# Patient Record
Sex: Female | Born: 1950 | Race: White | Hispanic: No | Smoking: Never smoker
Health system: Southern US, Community
[De-identification: ages and names within clinical notes are randomized; demographics above are authoritative.]

## PROBLEM LIST (undated history)

## (undated) DIAGNOSIS — Z950 Presence of cardiac pacemaker: Secondary | ICD-10-CM

## (undated) DIAGNOSIS — I509 Heart failure, unspecified: Secondary | ICD-10-CM

## (undated) DIAGNOSIS — I2510 Atherosclerotic heart disease of native coronary artery without angina pectoris: Secondary | ICD-10-CM

## (undated) DIAGNOSIS — F319 Bipolar disorder, unspecified: Secondary | ICD-10-CM

## (undated) DIAGNOSIS — I10 Essential (primary) hypertension: Secondary | ICD-10-CM

## (undated) HISTORY — PX: PACEMAKER INSERTION: SHX728

## (undated) HISTORY — PX: OTHER SURGICAL HISTORY: SHX169

## (undated) HISTORY — PX: ABDOMINAL HYSTERECTOMY: SHX81

---

## 2010-11-28 ENCOUNTER — Emergency Department (INDEPENDENT_AMBULATORY_CARE_PROVIDER_SITE_OTHER): Payer: Managed Care, Other (non HMO)

## 2010-11-28 ENCOUNTER — Emergency Department (HOSPITAL_BASED_OUTPATIENT_CLINIC_OR_DEPARTMENT_OTHER)
Admission: EM | Admit: 2010-11-28 | Discharge: 2010-11-28 | Disposition: A | Discharge status: Home / Self Care | Payer: Managed Care, Other (non HMO) | Attending: Emergency Medicine | Admitting: Emergency Medicine

## 2010-11-28 DIAGNOSIS — I509 Heart failure, unspecified: Secondary | ICD-10-CM | POA: Insufficient documentation

## 2010-11-28 DIAGNOSIS — G43909 Migraine, unspecified, not intractable, without status migrainosus: Secondary | ICD-10-CM | POA: Insufficient documentation

## 2010-11-28 DIAGNOSIS — R51 Headache: Secondary | ICD-10-CM

## 2010-11-28 DIAGNOSIS — I10 Essential (primary) hypertension: Secondary | ICD-10-CM | POA: Insufficient documentation

## 2010-11-28 DIAGNOSIS — G319 Degenerative disease of nervous system, unspecified: Secondary | ICD-10-CM | POA: Insufficient documentation

## 2010-11-28 DIAGNOSIS — Z950 Presence of cardiac pacemaker: Secondary | ICD-10-CM

## 2010-11-28 LAB — CBC
HCT: 39.6 % (ref 36.0–46.0)
Hemoglobin: 13.4 g/dL (ref 12.0–15.0)
MCHC: 33.8 g/dL (ref 30.0–36.0)
MCV: 93 fL (ref 78.0–100.0)
WBC: 6.4 10*3/uL (ref 4.0–10.5)

## 2010-11-28 LAB — DIFFERENTIAL
Basophils Absolute: 0.1 10*3/uL (ref 0.0–0.1)
Eosinophils Relative: 3 % (ref 0–5)
Lymphocytes Relative: 40 % (ref 12–46)
Lymphs Abs: 2.6 10*3/uL (ref 0.7–4.0)
Monocytes Absolute: 0.6 10*3/uL (ref 0.1–1.0)
Neutro Abs: 3 10*3/uL (ref 1.7–7.7)

## 2010-11-28 LAB — COMPREHENSIVE METABOLIC PANEL
ALT: 31 U/L (ref 0–35)
AST: 33 U/L (ref 0–37)
Alkaline Phosphatase: 42 U/L (ref 39–117)
CO2: 26 mEq/L (ref 19–32)
Chloride: 105 mEq/L (ref 96–112)
GFR calc Af Amer: >60 mL/min (ref >60)
GFR calc non Af Amer: >60 mL/min (ref >60)
Glucose, Bld: 109 mg/dL — ABNORMAL HIGH (ref 70–99)
Sodium: 141 mEq/L (ref 135–145)
Total Bilirubin: 1.1 mg/dL (ref 0.3–1.2)

## 2015-11-13 ENCOUNTER — Emergency Department (HOSPITAL_BASED_OUTPATIENT_CLINIC_OR_DEPARTMENT_OTHER): Payer: Medicare HMO

## 2015-11-13 ENCOUNTER — Emergency Department (HOSPITAL_BASED_OUTPATIENT_CLINIC_OR_DEPARTMENT_OTHER)
Admission: EM | Admit: 2015-11-13 | Discharge: 2015-11-13 | Disposition: A | Discharge status: Home / Self Care | Payer: Medicare HMO | Attending: Emergency Medicine | Admitting: Emergency Medicine

## 2015-11-13 ENCOUNTER — Encounter (HOSPITAL_BASED_OUTPATIENT_CLINIC_OR_DEPARTMENT_OTHER): Payer: Self-pay | Admitting: Emergency Medicine

## 2015-11-13 DIAGNOSIS — I10 Essential (primary) hypertension: Secondary | ICD-10-CM | POA: Diagnosis not present

## 2015-11-13 DIAGNOSIS — I509 Heart failure, unspecified: Secondary | ICD-10-CM | POA: Diagnosis not present

## 2015-11-13 DIAGNOSIS — Z792 Long term (current) use of antibiotics: Secondary | ICD-10-CM | POA: Diagnosis not present

## 2015-11-13 DIAGNOSIS — J9801 Acute bronchospasm: Secondary | ICD-10-CM | POA: Diagnosis not present

## 2015-11-13 DIAGNOSIS — Z950 Presence of cardiac pacemaker: Secondary | ICD-10-CM | POA: Insufficient documentation

## 2015-11-13 DIAGNOSIS — I2510 Atherosclerotic heart disease of native coronary artery without angina pectoris: Secondary | ICD-10-CM | POA: Insufficient documentation

## 2015-11-13 DIAGNOSIS — R0602 Shortness of breath: Secondary | ICD-10-CM | POA: Diagnosis present

## 2015-11-13 HISTORY — DX: Presence of cardiac pacemaker: Z95.0

## 2015-11-13 HISTORY — DX: Heart failure, unspecified: I50.9

## 2015-11-13 HISTORY — DX: Essential (primary) hypertension: I10

## 2015-11-13 HISTORY — DX: Atherosclerotic heart disease of native coronary artery without angina pectoris: I25.10

## 2015-11-13 MED ORDER — IPRATROPIUM-ALBUTEROL 0.5-2.5 (3) MG/3ML IN SOLN
3.0000 mL | RESPIRATORY_TRACT | Status: DC
  Administered 2015-11-13: 3 mL via RESPIRATORY_TRACT
  Filled 2015-11-13: qty 3

## 2015-11-13 MED ORDER — ALBUTEROL SULFATE HFA 108 (90 BASE) MCG/ACT IN AERS
2.0000 | INHALATION_SPRAY | RESPIRATORY_TRACT | Status: DC | PRN
  Administered 2015-11-13: 2 via RESPIRATORY_TRACT
  Filled 2015-11-13: qty 6.7

## 2015-11-13 NOTE — Discharge Instructions (Signed)
Bronchospasm, Adult  A bronchospasm is a spasm or tightening of the airways going into the lungs. During a bronchospasm breathing becomes more difficult because the airways get smaller. When this happens there can be coughing, a whistling sound when breathing (wheezing), and difficulty breathing. Bronchospasm is often associated with asthma, but not all patients who experience a bronchospasm have asthma.  CAUSES   A bronchospasm is caused by inflammation or irritation of the airways. The inflammation or irritation may be triggered by:   · Allergies (such as to animals, pollen, food, or mold). Allergens that cause bronchospasm may cause wheezing immediately after exposure or many hours later.    · Infection. Viral infections are believed to be the most common cause of bronchospasm.    · Exercise.    · Irritants (such as pollution, cigarette smoke, strong odors, aerosol sprays, and paint fumes).    · Weather changes. Winds increase molds and pollens in the air. Rain refreshes the air by washing irritants out. Cold air may cause inflammation.    · Stress and emotional upset.    SIGNS AND SYMPTOMS   · Wheezing.    · Excessive nighttime coughing.    · Frequent or severe coughing with a simple cold.    · Chest tightness.    · Shortness of breath.    DIAGNOSIS   Bronchospasm is usually diagnosed through a history and physical exam. Tests, such as chest X-rays, are sometimes done to look for other conditions.  TREATMENT   · Inhaled medicines can be given to open up your airways and help you breathe. The medicines can be given using either an inhaler or a nebulizer machine.  · Corticosteroid medicines may be given for severe bronchospasm, usually when it is associated with asthma.  HOME CARE INSTRUCTIONS   · Always have a plan prepared for seeking medical care. Know when to call your health care provider and local emergency services (911 in the [place] Know where you can access local emergency care.  · Only take medicines as  directed by your health care provider.  · If you were prescribed an inhaler or nebulizer machine, ask your health care provider to explain how to use it correctly. Always use a spacer with your inhaler if you were given one.  · It is necessary to remain calm during an attack. Try to relax and breathe more slowly.   · Control your home environment in the following ways:      Change your heating and air conditioning filter at least once a month.      Limit your use of fireplaces and wood stoves.    Do not smoke and do not allow smoking in your home.      Avoid exposure to perfumes and fragrances.      Get rid of pests (such as roaches and mice) and their droppings.      Throw away plants if you see mold on them.      Keep your house clean and dust free.      Replace carpet with wood, tile, or vinyl flooring. Carpet can trap dander and dust.      Use allergy-proof pillows, mattress covers, and box spring covers.      Wash bed sheets and blankets every week in hot water and dry them in a dryer.      Use blankets that are made of polyester or cotton.      Wash hands frequently.  SEEK MEDICAL CARE IF:   · You have muscle aches.    · You have chest pain.    · The sputum changes from clear or   white to yellow, green, gray, or bloody.    · The sputum you cough up gets thicker.    · There are problems that may be related to the medicine you are given, such as a rash, itching, swelling, or trouble breathing.    SEEK IMMEDIATE MEDICAL CARE IF:   · You have worsening wheezing and coughing even after taking your prescribed medicines.    · You have increased difficulty breathing.    · You develop severe chest pain.  MAKE SURE YOU:   · Understand these instructions.  · Will watch your condition.  · Will get help right away if you are not doing well or get worse.     This information is not intended to replace advice given to you by your health care provider. Make sure you discuss any questions you have with your health care  provider.     Document Released: 09/04/2003 Document Revised: 09/22/2014 Document Reviewed: 02/21/2013  Elsevier Interactive Patient Education ©2016 [employer]

## 2015-11-13 NOTE — ED Notes (Signed)
Sob, productive cough

## 2015-11-13 NOTE — ED Notes (Signed)
Pt was seen at [facility] twice withhin the last 1week for same. Pt has had two xrays.

## 2015-11-13 NOTE — ED Notes (Signed)
C/o cough and sob over a week,  Has been seen at [facility] without any relief

## 2015-11-13 NOTE — ED Provider Notes (Signed)
CSN: [id number]     Arrival date & time 11/13/15  0002 History  By signing my name below, I, Lauren Skinner, attest that this documentation has been prepared under the direction and in the presence of Paula Libra, MD. Electronically Signed: Bethel Skinner, ED Scribe. 11/13/2015. 12:36 AM   Chief Complaint  Patient presents with  . Shortness of Breath   The history is provided by the patient. No language interpreter was used.   Lauren Skinner is a 65 y.o. female with history of CHF with a pacemaker, CAD, and HTN who presents to the Emergency Department complaining of SOB with onset more than 1 week ago. Pt states that she has been seen in the ED at [facility] 3 times in the last week for the same and was prescribed Zithromax that caused her to have a rash. Associated symptoms include cough productive of frothy light green sputum, hoarse voice and wheezing. Symptoms are moderate and worse with exertion. She does not have an inhaler or a nebulizer machine at home. Pt denies fever.    Past Medical History  Diagnosis Date  . Pacemaker   . CHF (congestive heart failure) (HCC)   . Hypertension   . Coronary artery disease    Past Surgical History  Procedure Laterality Date  . Abdominal hysterectomy    . C section    . Pacemaker insertion     No family history on file. Social History  Substance Use Topics  . Smoking status: Never Smoker   . Smokeless tobacco: None  . Alcohol Use: No   OB History    No data available     Review of Systems  10 Systems reviewed and all are negative for acute change except as noted in the HPI.  Allergies  Review of patient's allergies indicates no known allergies.  Home Medications   Prior to Admission medications   Medication Sig Start Date End Date Taking? Authorizing Provider  azithromycin (ZITHROMAX) 250 MG tablet Take by mouth daily.   Yes Historical Provider, MD   BP 135/90 mmHg  Pulse 88  Temp(Src) 99.1 F (37.3 C) (Oral)   Resp 18  Ht  (1.626 m)  Wt 240 lb (108.863 kg)  BMI 41.18 kg/m2  SpO2 99% Physical Exam  Nursing note and vitals reviewed. General: Well-developed, well-nourished female in no acute distress; appearance consistent with age of record HENT: normocephalic; atraumatic Eyes: pupils equal, round and reactive to light; extraocular muscles intact Neck: supple Heart: regular rate and rhythm Lungs: Decreased air movement bilaterally; coughing on deep breathing  Abdomen: soft; nondistended; nontender; bowel sounds present Extremities: No deformity; full range of motion; pulses normal Neurologic: Awake, alert and oriented; motor function intact in all extremities and symmetric; no facial droop Skin: Warm and dry; multiple seborrhoic keratoses   Psychiatric: Normal mood and affect  ED Course  Procedures (including critical care time) DIAGNOSTIC STUDIES: Oxygen Saturation is 99% on RA,  normal by my interpretation.     MDM  1:50 AM Air movement improved after DuoNeb treatment. Will provide patient with an inhaler.    Paula Libra, MD 11/13/15 [phone]

## 2015-11-15 ENCOUNTER — Encounter (HOSPITAL_BASED_OUTPATIENT_CLINIC_OR_DEPARTMENT_OTHER): Payer: Self-pay

## 2015-11-15 ENCOUNTER — Emergency Department (HOSPITAL_BASED_OUTPATIENT_CLINIC_OR_DEPARTMENT_OTHER)
Admission: EM | Admit: 2015-11-15 | Discharge: 2015-11-15 | Disposition: A | Discharge status: Home / Self Care | Payer: Medicare HMO | Attending: Emergency Medicine | Admitting: Emergency Medicine

## 2015-11-15 ENCOUNTER — Emergency Department (HOSPITAL_BASED_OUTPATIENT_CLINIC_OR_DEPARTMENT_OTHER): Payer: Medicare HMO

## 2015-11-15 DIAGNOSIS — Z880 Allergy status to penicillin: Secondary | ICD-10-CM | POA: Diagnosis not present

## 2015-11-15 DIAGNOSIS — J45909 Unspecified asthma, uncomplicated: Secondary | ICD-10-CM

## 2015-11-15 DIAGNOSIS — I509 Heart failure, unspecified: Secondary | ICD-10-CM | POA: Diagnosis not present

## 2015-11-15 DIAGNOSIS — Z792 Long term (current) use of antibiotics: Secondary | ICD-10-CM | POA: Diagnosis not present

## 2015-11-15 DIAGNOSIS — I10 Essential (primary) hypertension: Secondary | ICD-10-CM | POA: Diagnosis not present

## 2015-11-15 DIAGNOSIS — Z79899 Other long term (current) drug therapy: Secondary | ICD-10-CM | POA: Insufficient documentation

## 2015-11-15 DIAGNOSIS — R0989 Other specified symptoms and signs involving the circulatory and respiratory systems: Secondary | ICD-10-CM

## 2015-11-15 DIAGNOSIS — I2510 Atherosclerotic heart disease of native coronary artery without angina pectoris: Secondary | ICD-10-CM | POA: Insufficient documentation

## 2015-11-15 DIAGNOSIS — F329 Major depressive disorder, single episode, unspecified: Secondary | ICD-10-CM | POA: Insufficient documentation

## 2015-11-15 DIAGNOSIS — R0602 Shortness of breath: Secondary | ICD-10-CM | POA: Diagnosis present

## 2015-11-15 DIAGNOSIS — J45901 Unspecified asthma with (acute) exacerbation: Secondary | ICD-10-CM | POA: Insufficient documentation

## 2015-11-15 HISTORY — DX: Bipolar disorder, unspecified: F31.9

## 2015-11-15 LAB — CBC WITH DIFFERENTIAL/PLATELET
BASOS ABS: 0 10*3/uL (ref 0.0–0.1)
BASOS PCT: 1 %
EOS ABS: 0.2 10*3/uL (ref 0.0–0.7)
EOS PCT: 3 %
HCT: 40.5 % (ref 36.0–46.0)
Hemoglobin: 13.1 g/dL (ref 12.0–15.0)
LYMPHS PCT: 26 %
Lymphs Abs: 1.8 10*3/uL (ref 0.7–4.0)
MCH: 32.1 pg (ref 26.0–34.0)
MCHC: 32.3 g/dL (ref 30.0–36.0)
MCV: 99.3 fL (ref 78.0–100.0)
MONO ABS: 0.8 10*3/uL (ref 0.1–1.0)
Monocytes Relative: 12 %
Neutro Abs: 3.9 10*3/uL (ref 1.7–7.7)
Neutrophils Relative %: 58 %
PLATELETS: 199 10*3/uL (ref 150–400)
RBC: 4.08 MIL/uL (ref 3.87–5.11)
RDW: 13.9 % (ref 11.5–15.5)
WBC: 6.7 10*3/uL (ref 4.0–10.5)

## 2015-11-15 LAB — BASIC METABOLIC PANEL
ANION GAP: 10 (ref 5–15)
BUN: 12 mg/dL (ref 6–20)
CALCIUM: 9 mg/dL (ref 8.9–10.3)
CO2: 27 mmol/L (ref 22–32)
Chloride: 100 mmol/L — ABNORMAL LOW (ref 101–111)
Creatinine, Ser: 0.78 mg/dL (ref 0.44–1.00)
GFR calc Af Amer: >60 mL/min (ref >60)
GLUCOSE: 102 mg/dL — AB (ref 65–99)
Potassium: 4.2 mmol/L (ref 3.5–5.1)
SODIUM: 137 mmol/L (ref 135–145)

## 2015-11-15 LAB — TROPONIN I

## 2015-11-15 LAB — BRAIN NATRIURETIC PEPTIDE: B Natriuretic Peptide: 373 pg/mL — ABNORMAL HIGH (ref 0.0–100.0)

## 2015-11-15 MED ORDER — PREDNISONE 20 MG PO TABS: 60.0000 mg | ORAL_TABLET | Freq: Every day | ORAL | Status: AC

## 2015-11-15 MED ORDER — FUROSEMIDE 10 MG/ML IJ SOLN
40.0000 mg | Freq: Once | INTRAMUSCULAR | Status: AC
  Administered 2015-11-15: 40 mg via INTRAVENOUS
  Filled 2015-11-15: qty 4

## 2015-11-15 MED ORDER — METHYLPREDNISOLONE SODIUM SUCC 125 MG IJ SOLR
125.0000 mg | Freq: Once | INTRAMUSCULAR | Status: AC
  Administered 2015-11-15: 125 mg via INTRAVENOUS
  Filled 2015-11-15: qty 2

## 2015-11-15 NOTE — Discharge Instructions (Signed)
Your labs today were reassuring. Her chest x-ray showed mild vascular congestion and we have given you a dose of Lasix in your IV. I recommend for the next 3 days you increase her Lasix from 20 mg once a day to 40 mg once a day. After these 3 days go back to your regular dose of Lasix 20 mg once a day.   I suspect that some of your symptoms are also secondary to asthma. We are spinning you on a short course of steroids that may help with her symptoms. You may start these steroids the morning of March 3 as we have given you a dose of IV steroids in the emergency department.   Your chest x-ray also showed a possible left-sided opacity that could be pneumonia versus a normal finding called atelectasis. Given your cough is improving and is no longer productive, you don't have fever and don't have an elevated white blood cell count think pneumonia is less likely. Given your multiple allergies to multiple antibiotics I feel that putting you on antibiotics when I think pneumonia is unlikely could be dangerous to do and I recommend that if your symptoms aren't improving despite increased dose of your Lasix as well as steroids and albuterol that you follow-up with your primary care physician to discuss further treatment including the possibility of being placed on antibiotics.    Asthma, Acute Bronchospasm Acute bronchospasm caused by asthma is also referred to as an asthma attack. Bronchospasm means your air passages become narrowed. The narrowing is caused by inflammation and tightening of the muscles in the air tubes (bronchi) in your lungs. This can make it hard to breathe or cause you to wheeze and cough. CAUSES Possible triggers are: 1. Animal dander from the skin, hair, or feathers of animals. 2. Dust mites contained in house dust. 3. Cockroaches. 4. Pollen from trees or grass. 5. Mold. 6. Cigarette or tobacco smoke. 7. Air pollutants such as dust, household cleaners, hair sprays, aerosol sprays,  paint fumes, strong chemicals, or strong odors. 8. Cold air or weather changes. Cold air may trigger inflammation. Winds increase molds and pollens in the air. 9. Strong emotions such as crying or laughing hard. 10. Stress. 11. Certain medicines such as aspirin or beta-blockers. 12. Sulfites in foods and drinks, such as dried fruits and wine. 13. Infections or inflammatory conditions, such as a flu, cold, or inflammation of the nasal membranes (rhinitis). 14. Gastroesophageal reflux disease (GERD). GERD is a condition where stomach acid backs up into your esophagus. 15. Exercise or strenuous activity. SIGNS AND SYMPTOMS  1. Wheezing. 2. Excessive coughing, particularly at night. 3. Chest tightness. 4. Shortness of breath. DIAGNOSIS  Your health care provider will ask you about your medical history and perform a physical exam. A chest X-ray or blood testing may be performed to look for other causes of your symptoms or other conditions that may have triggered your asthma attack. TREATMENT  Treatment is aimed at reducing inflammation and opening up the airways in your lungs. Most asthma attacks are treated with inhaled medicines. These include quick relief or rescue medicines (such as bronchodilators) and controller medicines (such as inhaled corticosteroids). These medicines are sometimes given through an inhaler or a nebulizer. Systemic steroid medicine taken by mouth or given through an IV tube also can be used to reduce the inflammation when an attack is moderate or severe. Antibiotic medicines are only used if a bacterial infection is present.  HOME CARE INSTRUCTIONS   Rest.  Drink plenty of liquids. This helps the mucus to remain thin and be easily coughed up. Only use caffeine in moderation and do not use alcohol until you have recovered from your illness.  Do not smoke. Avoid being exposed to secondhand smoke.  You play a critical role in keeping yourself in good health. Avoid  exposure to things that cause you to wheeze or to have breathing problems.  Keep your medicines up-to-date and available. Carefully follow your health care provider's treatment plan.  Take your medicine exactly as prescribed.  When pollen or pollution is bad, keep windows closed and use an air conditioner or go to places with air conditioning.  Asthma requires careful medical care. See your health care provider for a follow-up as advised. If you are more than [redacted] weeks pregnant and you were prescribed any new medicines, let your obstetrician know about the visit and how you are doing. Follow up with your health care provider as directed.  After you have recovered from your asthma attack, make an appointment with your outpatient doctor to talk about ways to reduce the likelihood of future attacks. If you do not have a doctor who manages your asthma, make an appointment with a primary care doctor to discuss your asthma. SEEK IMMEDIATE MEDICAL CARE IF:   You are getting worse.  You have trouble breathing. If severe, call your local emergency services (911 in the [place]  You develop chest pain or discomfort.  You are vomiting.  You are not able to keep fluids down.  You are coughing up yellow, green, brown, or bloody sputum.  You have a fever and your symptoms suddenly get worse.  You have trouble swallowing. MAKE SURE YOU:   Understand these instructions.  Will watch your condition.  Will get help right away if you are not doing well or get worse.   This information is not intended to replace advice given to you by your health care provider. Make sure you discuss any questions you have with your health care provider.   Document Released: 12/17/2006 Document Revised: 09/06/2013 Document Reviewed: 03/09/2013 Elsevier Interactive Patient Education 2016 [employer].  How to Use an Inhaler Proper inhaler technique is very important. Good technique ensures that the medicine reaches  the lungs. Poor technique results in depositing the medicine on the tongue and back of the throat rather than in the airways. If you do not use the inhaler with good technique, the medicine will not help you. STEPS TO FOLLOW IF USING AN INHALER WITHOUT AN EXTENSION TUBE 16. Remove the cap from the inhaler. 17. If you are using the inhaler for the first time, you will need to prime it. Shake the inhaler for 5 seconds and release four puffs into the air, away from your face. Ask your health care provider or pharmacist if you have questions about priming your inhaler. 18. Shake the inhaler for 5 seconds before each breath in (inhalation). 19. Position the inhaler so that the top of the canister faces up. 20. Put your index finger on the top of the medicine canister. Your thumb supports the bottom of the inhaler. 21. Open your mouth. 22. Either place the inhaler between your teeth and place your lips tightly around the mouthpiece, or hold the inhaler 1-2 inches away from your open mouth. If you are unsure of which technique to use, ask your health care provider. 23. Breathe out (exhale) normally and as completely as possible. 24. Press the canister down with your index  finger to release the medicine. 25. At the same time as the canister is pressed, inhale deeply and slowly until your lungs are completely filled. This should take 4-6 seconds. Keep your tongue down. 26. Hold the medicine in your lungs for 5-10 seconds (10 seconds is best). This helps the medicine get into the small airways of your lungs. 27. Breathe out slowly, through pursed lips. Whistling is an example of pursed lips. 28. Wait at least 15-30 seconds between puffs. Continue with the above steps until you have taken the number of puffs your health care provider has ordered. Do not use the inhaler more than your health care provider tells you. 29. Replace the cap on the inhaler. 30. Follow the directions from your health care provider or  the inhaler insert for cleaning the inhaler. STEPS TO FOLLOW IF USING AN INHALER WITH AN EXTENSION (SPACER) 5. Remove the cap from the inhaler. 6. If you are using the inhaler for the first time, you will need to prime it. Shake the inhaler for 5 seconds and release four puffs into the air, away from your face. Ask your health care provider or pharmacist if you have questions about priming your inhaler. 7. Shake the inhaler for 5 seconds before each breath in (inhalation). 8. Place the open end of the spacer onto the mouthpiece of the inhaler. 9. Position the inhaler so that the top of the canister faces up and the spacer mouthpiece faces you. 10. Put your index finger on the top of the medicine canister. Your thumb supports the bottom of the inhaler and the spacer. 11. Breathe out (exhale) normally and as completely as possible. 12. Immediately after exhaling, place the spacer between your teeth and into your mouth. Close your lips tightly around the spacer. 13. Press the canister down with your index finger to release the medicine. 14. At the same time as the canister is pressed, inhale deeply and slowly until your lungs are completely filled. This should take 4-6 seconds. Keep your tongue down and out of the way. 15. Hold the medicine in your lungs for 5-10 seconds (10 seconds is best). This helps the medicine get into the small airways of your lungs. Exhale. 16. Repeat inhaling deeply through the spacer mouthpiece. Again hold that breath for up to 10 seconds (10 seconds is best). Exhale slowly. If it is difficult to take this second deep breath through the spacer, breathe normally several times through the spacer. Remove the spacer from your mouth. 17. Wait at least 15-30 seconds between puffs. Continue with the above steps until you have taken the number of puffs your health care provider has ordered. Do not use the inhaler more than your health care provider tells you. 18. Remove the spacer from  the inhaler, and place the cap on the inhaler. 19. Follow the directions from your health care provider or the inhaler insert for cleaning the inhaler and spacer. If you are using different kinds of inhalers, use your quick relief medicine to open the airways 10-15 minutes before using a steroid if instructed to do so by your health care provider. If you are unsure which inhalers to use and the order of using them, ask your health care provider, nurse, or respiratory therapist. If you are using a steroid inhaler, always rinse your mouth with water after your last puff, then gargle and spit out the water. Do not swallow the water. AVOID:  Inhaling before or after starting the spray of medicine. It takes  practice to coordinate your breathing with triggering the spray.  Inhaling through the nose (rather than the mouth) when triggering the spray. HOW TO DETERMINE IF YOUR INHALER IS FULL OR NEARLY EMPTY You cannot know when an inhaler is empty by shaking it. A few inhalers are now being made with dose counters. Ask your health care provider for a prescription that has a dose counter if you feel you need that extra help. If your inhaler does not have a counter, ask your health care provider to help you determine the date you need to refill your inhaler. Write the refill date on a calendar or your inhaler canister. Refill your inhaler 7-10 days before it runs out. Be sure to keep an adequate supply of medicine. This includes making sure it is not expired, and that you have a spare inhaler.  SEEK MEDICAL CARE IF:   Your symptoms are only partially relieved with your inhaler.  You are having trouble using your inhaler.  You have some increase in phlegm. SEEK IMMEDIATE MEDICAL CARE IF:   You feel little or no relief with your inhalers. You are still wheezing and are feeling shortness of breath or tightness in your chest or both.  You have dizziness, headaches, or a fast heart rate.  You have chills,  fever, or night sweats.  You have a noticeable increase in phlegm production, or there is blood in the phlegm. MAKE SURE YOU:   Understand these instructions.  Will watch your condition.  Will get help right away if you are not doing well or get worse.   This information is not intended to replace advice given to you by your health care provider. Make sure you discuss any questions you have with your health care provider.   Document Released: 08/29/2000 Document Revised: 06/22/2013 Document Reviewed: 03/31/2013 Elsevier Interactive Patient Education [employer]

## 2015-11-15 NOTE — ED Provider Notes (Signed)
TIME SEEN: 3:15 AM  CHIEF COMPLAINT: Shortness of breath  HPI: Patient is a 65 year old female with history of asthma, CHF status post pacemaker, CAD, hypertension who presents emergency department with complaints of shortness of breath and cough that she has had since February 13. Patient reports shortness of breath is worse with lying flat and better with sitting upright. States that she has had white and green-colored sputum but now her cough is dry. She denies any fever. No chest pain or chest discomfort. She has chronic bilateral lower extreme swelling that she states has not changed. She is on Lasix 20 mg once a day. Her cardiologist is with [facility]. She recently established care with Velna Ochs who is a [employer] rights [employer].  States she has had some wheezing. Was given an albuterol inhaler with some relief. Has not been on steroids that she states she has been told that they will "mess up my heart failure". Also is told at some point that she may have bacterial infection was started on azithromycin which caused her to have a rash. She is not on antibiotics currently.  She denies history of PE or DVT.  Patient was seen by her primary care provider Norvel Richards on February 13. Was seen in the emergency department at [facility] on February 21st, 24th and 25th. Was seen [facility] on February 28.   ROS: See HPI Constitutional: no fever  Eyes: no drainage  ENT: no runny nose   Cardiovascular:  no chest pain  Resp: SOB  GI: no vomiting GU: no dysuria Integumentary: no rash  Allergy: no hives  Musculoskeletal: no leg swelling  Neurological: no slurred speech ROS otherwise negative  PAST MEDICAL HISTORY/PAST SURGICAL HISTORY:  Past Medical History  Diagnosis Date  . Pacemaker   . CHF (congestive heart failure) (HCC)   . Hypertension   . Coronary artery disease   . Bipolar disorder (HCC)     MEDICATIONS:  Prior to Admission  medications   Medication Sig Start Date End Date Taking? Authorizing Provider  albuterol (PROVENTIL HFA;VENTOLIN HFA) 108 (90 Base) MCG/ACT inhaler Inhale 2 puffs into the lungs every 6 (six) hours as needed for wheezing or shortness of breath.   Yes Historical Provider, MD  DULoxetine (CYMBALTA) 60 MG capsule Take 60 mg by mouth daily.   Yes Historical Provider, MD  gabapentin (NEURONTIN) 300 MG capsule Take 300 mg by mouth 3 (three) times daily.   Yes Historical Provider, MD  LORazepam (ATIVAN) 1 MG tablet Take 1 mg by mouth every 8 (eight) hours as needed for anxiety.   Yes Historical Provider, MD  QUEtiapine (SEROQUEL) 100 MG tablet Take 100 mg by mouth at bedtime.   Yes Historical Provider, MD  Vitamin D, Ergocalciferol, (DRISDOL) 50000 units CAPS capsule Take 50,000 Units by mouth every 7 (seven) days.   Yes Historical Provider, MD  azithromycin (ZITHROMAX) 250 MG tablet Take by mouth daily.    Historical Provider, MD    ALLERGIES:  Allergies  Allergen Reactions  . Azithromycin   . Ciprofloxacin   . Penicillins     Pt states she is allergic to "all antibiotics"    SOCIAL HISTORY:  Social History  Substance Use Topics  . Smoking status: Never Smoker   . Smokeless tobacco: Not on file  . Alcohol Use: No    FAMILY HISTORY: No family history on file.  EXAM: BP 136/93 mmHg  Pulse 99  Temp(Src) 99.2 F (37.3 C) (  Oral)  Resp 18  Ht  (1.626 m)  Wt 240 lb (108.863 kg)  BMI 41.18 kg/m2  SpO2 98% CONSTITUTIONAL: Alert and oriented and responds appropriately to questions. Chronically ill appearing, well-nourished, in no distress, afebrile and nontoxic HEAD: Normocephalic EYES: Conjunctivae clear, PERRL ENT: normal nose; no rhinorrhea; moist mucous membranes NECK: Supple, no meningismus, no LAD; no JVD CARD: RRR; S1 and S2 appreciated; no murmurs, no clicks, no rubs, no gallops RESP: Normal chest excursion without splinting or tachypnea; breath sounds clear and equal  bilaterally; no wheezes, no rhonchi, no rales, no hypoxia or respiratory distress, speaking full sentences ABD/GI: Normal bowel sounds; non-distended; soft, non-tender, no rebound, no guarding, no peritoneal signs BACK:  The back appears normal and is non-tender to palpation, there is no CVA tenderness EXT: Normal ROM in all joints; non-tender to palpation; bilateral lower extremity swelling to the feet into the midcalf without pitting she reports is chronic and unchanged, no erythema or warmth; normal capillary refill; no cyanosis, no calf tenderness or swelling    SKIN: Normal color for age and race; warm; no rash NEURO: Moves all extremities equally, sensation to light touch intact diffusely, cranial nerves II through XII intact PSYCH: Patient has a bizarre affect. No SI or HI. Laughing inappropriately. No hallucinations. Poorly groomed. Has dirt caked to the bottom of her feet.  MEDICAL DECISION MAKING: Patient here with complaints of chronic shortness of breath. Symptoms have been present for over 2 weeks. She does not appear significantly volume overloaded on exam.  She does have bilateral lower extremity swelling but states this is chronic. No calf tenderness. No history of PE. She is not tachycardic, tachypneic or hypoxic today. Her lungs are clear to auscultation with good aeration, no wheezing or rhonchi, no rales. She is speaking full sentences without increased work of breathing. Patient has had 2 chest x-rays at [facility] and have not shown infiltrates, edema. We'll repeat workup today including troponin, BNP and chest x-ray.  This does not look like a CHF exacerbation. I have low suspicion that this is her anginal equivalent given she reports she has intermittent wheezing and cough. Doubt PNA given cough improving and no fever.  Pt also reports "I am allergic to all antibiotics."  Suspect this is likely bronchospasm from underlying history of asthma. I do not feel she needs  breathing treatments at this time as her lungs are clear with good aeration. She has an albuterol inhaler at home. She refuses to be on steroids. Have had a lengthy discussion with patient and her son at bedside that she should follow-up with her primary care physician. Her EKG shows a paced rhythm with no other acute abnormality.  ED PROGRESS: Patient's labs show negative troponin. Normal hemoglobin with no leukocytosis. Likely normal. BNP mildly elevated at 373. Chest x-ray shows mild vascular congestion and borderline cardiomegaly. There is mild left basilar airspace opacity which could reflect pneumonia versus atelectasis. I have reviewed this image and have low suspicion for pneumonia at this time given no fever, no leukocytosis and improving cough. She has no pleural effusion. Have discussed this finding with patient and we have both agreed that she would like to primary increasing her diuresis for the next 3 days and using albuterol inhaler as well as steroids for the next several days before starting antibiotics given she reports multiple allergies including penicillins, sulfa antibiotics, azithromycin, fluoroquinolones. She states that she thinks she has had doxycycline in the past but is  not sure. She agrees to follow-up with her primary care physician if symptoms are not improving or worsen. Have discussed return cautions and reasons to come back to the emergency department. Patient and son at bedside verbalize understanding and are comfortable with this plan. Patient continues to have normal oxygen saturation and has been able to ambulate with no hypoxia.    EKG Interpretation  Date/Time:  Thursday November 15 2015 03:41:22 EST Ventricular Rate:  100 PR Interval:  165 QRS Duration: 127 QT Interval:  386 QTC Calculation: 498 R Axis:   -113 Text Interpretation:  Atrial-sensed ventricular-paced rhythm No further analysis attempted due to paced rhythm Paced rhythm compared to prior EKG in 2012  Confirmed by Tyara Dassow,  DO, Alisah Grandberry [phone]) on 11/15/2015 3:43:31 AM        Lauren Skinner Kevyn Boquet, DO 11/15/15 [id number]

## 2015-11-15 NOTE — ED Notes (Signed)
Pt c/o increased confusion r/t "I'm oxygen deprived."  States she has been short of breath for over a week and has had productive sputum with post tussive emesis.  She states the inhaler she was given is not helping and that she is not feeling any better.  Pt is able to speak in full sentences.  Denies any worsening of symptoms, but states she is not getting any better.  She does not have a PCP, states she is allergic to all antibiotics and that she has been here and [facility] without any improvement of symptoms.

## 2015-11-15 NOTE — ED Notes (Signed)
Pt verbalizes understanding of d/c instructions and denies any further need at this time. 

## 2015-11-15 NOTE — ED Notes (Signed)
MD at bedside. 

## 2015-11-15 NOTE — ED Notes (Signed)
Pt ambulated around nurse's station and pulse ox improved from 95% to 99% with steady pleth

## 2015-11-15 NOTE — ED Notes (Signed)
Patient transported to X-ray 

## 2015-11-15 NOTE — ED Notes (Addendum)
Pt ambulating quite gingerly to restroom without any issue or shortness of breath

## 2018-02-13 DEATH — deceased
